# Patient Record
Sex: Female | Born: 1965 | Race: White | Hispanic: No | Marital: Married | Smoking: Never smoker
Health system: Southern US, Community
[De-identification: ages and names within clinical notes are randomized; demographics above are authoritative.]

## PROBLEM LIST (undated history)

## (undated) HISTORY — PX: BREAST EXCISIONAL BIOPSY: SUR124

## (undated) HISTORY — PX: BREAST BIOPSY: SHX20

---

## 2007-08-06 DIAGNOSIS — D239 Other benign neoplasm of skin, unspecified: Secondary | ICD-10-CM

## 2007-08-06 HISTORY — DX: Other benign neoplasm of skin, unspecified: D23.9

## 2013-02-09 DIAGNOSIS — D229 Melanocytic nevi, unspecified: Secondary | ICD-10-CM

## 2013-02-09 HISTORY — DX: Melanocytic nevi, unspecified: D22.9

## 2020-02-25 ENCOUNTER — Encounter: Payer: Self-pay | Admitting: Emergency Medicine

## 2020-02-25 ENCOUNTER — Ambulatory Visit
Admission: EM | Admit: 2020-02-25 | Discharge: 2020-02-25 | Disposition: A | Discharge status: Home / Self Care | Payer: No Typology Code available for payment source | Attending: Sports Medicine | Admitting: Sports Medicine

## 2020-02-25 ENCOUNTER — Other Ambulatory Visit: Payer: Self-pay

## 2020-02-25 DIAGNOSIS — R051 Acute cough: Secondary | ICD-10-CM | POA: Diagnosis present

## 2020-02-25 DIAGNOSIS — J0100 Acute maxillary sinusitis, unspecified: Secondary | ICD-10-CM | POA: Diagnosis not present

## 2020-02-25 DIAGNOSIS — Z20822 Contact with and (suspected) exposure to covid-19: Secondary | ICD-10-CM | POA: Insufficient documentation

## 2020-02-25 DIAGNOSIS — Z79899 Other long term (current) drug therapy: Secondary | ICD-10-CM | POA: Diagnosis not present

## 2020-02-25 LAB — RESP PANEL BY RT-PCR (FLU A&B, COVID) ARPGX2
Influenza A by PCR: NEGATIVE
Influenza B by PCR: NEGATIVE
SARS Coronavirus 2 by RT PCR: NEGATIVE

## 2020-02-25 MED ORDER — BENZONATATE 100 MG PO CAPS: 200.0000 mg | ORAL_CAPSULE | Freq: Three times a day (TID) | ORAL | 0 refills | Status: AC

## 2020-02-25 MED ORDER — AMOXICILLIN-POT CLAVULANATE 875-125 MG PO TABS: 1.0000 | ORAL_TABLET | Freq: Two times a day (BID) | ORAL | 0 refills | Status: AC

## 2020-02-25 NOTE — ED Triage Notes (Signed)
Pt c/o cough, nasal congestion. Started about 3 days ago. She has had covid vaccines.

## 2020-02-25 NOTE — ED Provider Notes (Signed)
[facility]    CSN: [id number] Arrival date & time: 02/25/20  1110      History   Chief Complaint Chief Complaint  Patient presents with  . Nasal Congestion  . Cough    HPI Priscilla Bradshaw is a 54 y.o. female.   HPI   54 year old female here for evaluation of nasal congestion and cough.  Patient reports that she has had symptoms for the past 3 days.  She has associated symptoms of ear pressure, headache, scratchy throat, yellow nasal discharge, pain in her teeth, and nonproductive cough.  Patient denies fever, shortness of breath or wheezing, body aches, changes to sense of taste or smell.  Patient has received both her Covid and flu vaccines but not her Covid booster.  History reviewed. No pertinent past medical history.  There are no problems to display for this patient.   History reviewed. No pertinent surgical history.  OB History   No obstetric history on file.      Home Medications    Prior to Admission medications   Medication Sig Start Date End Date Taking? Authorizing Provider  atorvastatin (LIPITOR) 10 MG tablet Take 10 mg by mouth daily. 12/26/19  Yes [provider]  citalopram (CELEXA) 40 MG tablet Take 40 mg by mouth daily. 12/26/19  Yes [provider]  amoxicillin-clavulanate (AUGMENTIN) 875-125 MG tablet Take 1 tablet by mouth every 12 (twelve) hours for 10 days. 02/25/20 03/06/20  Margarette Canada, NP  benzonatate (TESSALON) 100 MG capsule Take 2 capsules (200 mg total) by mouth every 8 (eight) hours. 02/25/20   Margarette Canada, NP    Family History History reviewed. No pertinent family history.  Social History Social History   Tobacco Use  . Smoking status: Never Smoker  . Smokeless tobacco: Never Used  Vaping Use  . Vaping Use: Never used  Substance Use Topics  . Alcohol use: Not Currently  . Drug use: Not Currently     Allergies   Patient has no known allergies.   Review of Systems Review of Systems   Constitutional: Negative for activity change, appetite change and fever.  HENT: Positive for congestion, dental problem, ear pain, sinus pressure, sinus pain and sore throat. Negative for rhinorrhea.   Respiratory: Positive for cough. Negative for shortness of breath and wheezing.   Cardiovascular: Negative for chest pain.  Musculoskeletal: Negative for arthralgias and myalgias.  Skin: Negative for rash.  Neurological: Positive for headaches.  Hematological: Negative.   Psychiatric/Behavioral: Negative.      Physical Exam Triage Vital Signs ED Triage Vitals  Enc Vitals Group     BP 02/25/20 1212 120/69     Pulse Rate 02/25/20 1212 84     Resp 02/25/20 1212 18     Temp 02/25/20 1212 98.4 F (36.9 C)     Temp Source 02/25/20 1212 Oral     SpO2 02/25/20 1212 100 %     Weight 02/25/20 1210 153 lb (69.4 kg)     Height 02/25/20 1210 $RemoveBefor'5\' 3"'EkcTYkiqupHa$  (1.6 m)     Head Circumference --      Peak Flow --      Pain Score 02/25/20 1210 6     Pain Loc --      Pain Edu? --      Excl. in [place]? --    No data found.  Updated Vital Signs BP 120/69 (BP Location: Right Arm)   Pulse 84   Temp 98.4 F (36.9 C) (Oral)   Resp  18   Ht $R'5\' 3"'Dl$  (1.6 m)   Wt 153 lb (69.4 kg)   LMP  (Approximate)   SpO2 100%   BMI 27.10 kg/m   Visual Acuity Right Eye Distance:   Left Eye Distance:   Bilateral Distance:    Right Eye Near:   Left Eye Near:    Bilateral Near:     Physical Exam Vitals and nursing note reviewed.  Constitutional:      General: She is not in acute distress.    Appearance: Normal appearance. She is normal weight. She is ill-appearing.  HENT:     Head: Normocephalic and atraumatic.     Right Ear: Tympanic membrane, ear canal and external ear normal.     Left Ear: Tympanic membrane, ear canal and external ear normal.     Nose: Congestion and rhinorrhea present.     Comments: Patient has marked edema and erythema of bilateral nasal mucosa.  There is thick yellow discharge present in  both nares.  Patient does have tenderness to percussion of frontal and maxillary sinuses.    Mouth/Throat:     Mouth: Mucous membranes are moist.     Pharynx: Oropharynx is clear. Posterior oropharyngeal erythema present. No oropharyngeal exudate.     Comments: Posterior oropharynx has mild erythema with yellow postnasal drip. Cardiovascular:     Rate and Rhythm: Normal rate and regular rhythm.     Pulses: Normal pulses.     Heart sounds: Normal heart sounds. No murmur heard. No gallop.   Pulmonary:     Effort: Pulmonary effort is normal.     Breath sounds: Normal breath sounds. No wheezing, rhonchi or rales.  Skin:    General: Skin is warm and dry.     Capillary Refill: Capillary refill takes less than 2 seconds.     Findings: No erythema or rash.  Neurological:     General: No focal deficit present.     Mental Status: She is alert and oriented to person, place, and time.  Psychiatric:        Mood and Affect: Mood normal.        Behavior: Behavior normal.        Thought Content: Thought content normal.        Judgment: Judgment normal.      UC Treatments / Results  Labs (all labs ordered are listed, but only abnormal results are displayed) Labs Reviewed  RESP PANEL BY RT-PCR (FLU A&B, COVID) [id number]    EKG   Radiology No results found.  Procedures Procedures (including critical care time)  Medications Ordered in UC Medications - No data to display  Initial Impression / Assessment and Plan / UC Course  I have reviewed the triage vital signs and the nursing notes.  Pertinent labs & imaging results that were available during my care of the patient were reviewed by me and considered in my medical decision making (see chart for details).   Patient is here for evaluation of nasal congestion and dry cough that been going on for last 3 days.  Patient describes she feels a lot of pain and pressure in her forehead and her cheeks.  She is also experiencing pain in her upper  teeth.  Patient's not had a fever but she does have a longstanding history of maxillary sinusitis.  There is marked edema and erythema of her nasal mucosa bilaterally with thick yellow discharge.  Suspect patient has an acute on chronic maxillary sinusitis.  Will place patient  on Augmentin twice daily x10 days, encourage sinus irrigation, and give Tessalon Perles for cough.  Patient reports that decongestants caused tachycardia and palpitations with her.  Will skip Promethazine DM.  Respiratory panel was collected at triage and patient is negative for Covid and flu.   Final Clinical Impressions(s) / UC Diagnoses   Final diagnoses:  Acute maxillary sinusitis, recurrence not specified     Discharge Instructions     Take the Augmentin twice daily for 10 days with food to treat your sinusitis.  Perform sinus irrigation with a [employer] sinus rinse kit and distilled water 2-3 times a day to help break up nasal congestion.  Increase your oral fluid intake of water to help thin out your mucus and help your body clear.  You can take over-the-counter plain Mucinex to break up the stickiness of your mucus and make it easier for your body to clear.  Use the Tessalon Perles every 8 hours as needed for cough.  Take them with a small sip of water.  Some people will have an numbness to the base of your tongue or metallic taste in her mouth, this is normal.  If your symptoms persist follow-up with ear nose and throat.    ED Prescriptions    Medication Sig Dispense Auth. Provider   amoxicillin-clavulanate (AUGMENTIN) 875-125 MG tablet Take 1 tablet by mouth every 12 (twelve) hours for 10 days. 20 tablet Margarette Canada, NP   benzonatate (TESSALON) 100 MG capsule Take 2 capsules (200 mg total) by mouth every 8 (eight) hours. 21 capsule Margarette Canada, NP     PDMP not reviewed this encounter.   Margarette Canada, NP 02/25/20 1355

## 2020-02-25 NOTE — Discharge Instructions (Addendum)
Take the Augmentin twice daily for 10 days with food to treat your sinusitis.  Perform sinus irrigation with a NeilMed sinus rinse kit and distilled water 2-3 times a day to help break up nasal congestion.  Increase your oral fluid intake of water to help thin out your mucus and help your body clear.  You can take over-the-counter plain Mucinex to break up the stickiness of your mucus and make it easier for your body to clear.  Use the Tessalon Perles every 8 hours as needed for cough.  Take them with a small sip of water.  Some people will have an numbness to the base of your tongue or metallic taste in her mouth, this is normal.  If your symptoms persist follow-up with ear nose and throat.

## 2020-05-06 ENCOUNTER — Ambulatory Visit: Payer: Self-pay | Admitting: Dermatology

## 2020-06-21 ENCOUNTER — Ambulatory Visit: Payer: Self-pay | Admitting: Dermatology

## 2020-07-01 ENCOUNTER — Ambulatory Visit: Payer: No Typology Code available for payment source | Admitting: Dermatology

## 2020-09-01 ENCOUNTER — Ambulatory Visit (INDEPENDENT_AMBULATORY_CARE_PROVIDER_SITE_OTHER): Payer: No Typology Code available for payment source | Admitting: Dermatology

## 2020-09-01 ENCOUNTER — Other Ambulatory Visit: Payer: Self-pay

## 2020-09-01 DIAGNOSIS — L821 Other seborrheic keratosis: Secondary | ICD-10-CM

## 2020-09-01 DIAGNOSIS — Z86018 Personal history of other benign neoplasm: Secondary | ICD-10-CM | POA: Diagnosis not present

## 2020-09-01 DIAGNOSIS — L578 Other skin changes due to chronic exposure to nonionizing radiation: Secondary | ICD-10-CM

## 2020-09-01 DIAGNOSIS — D1800 Hemangioma unspecified site: Secondary | ICD-10-CM

## 2020-09-01 DIAGNOSIS — D485 Neoplasm of uncertain behavior of skin: Secondary | ICD-10-CM | POA: Diagnosis not present

## 2020-09-01 DIAGNOSIS — L918 Other hypertrophic disorders of the skin: Secondary | ICD-10-CM

## 2020-09-01 DIAGNOSIS — D229 Melanocytic nevi, unspecified: Secondary | ICD-10-CM

## 2020-09-01 DIAGNOSIS — L814 Other melanin hyperpigmentation: Secondary | ICD-10-CM | POA: Diagnosis not present

## 2020-09-01 DIAGNOSIS — Z1283 Encounter for screening for malignant neoplasm of skin: Secondary | ICD-10-CM | POA: Diagnosis not present

## 2020-09-01 DIAGNOSIS — D492 Neoplasm of unspecified behavior of bone, soft tissue, and skin: Secondary | ICD-10-CM

## 2020-09-01 NOTE — Patient Instructions (Signed)
Wound Care Instructions  Cleanse wound gently with soap and water once a day then pat dry with clean gauze. Apply a thing coat of Petrolatum (petroleum jelly, "Vaseline") over the wound (unless you have an allergy to this). We recommend that you use a new, sterile tube of Vaseline. Do not pick or remove scabs. Do not remove the yellow or white "healing tissue" from the base of the wound.  Cover the wound with fresh, clean, nonstick gauze and secure with paper tape. You may use Band-Aids in place of gauze and tape if the would is small enough, but would recommend trimming much of the tape off as there is often too much. Sometimes Band-Aids can irritate the skin.  You should call the office for your biopsy report after 1 week if you have not already been contacted.  If you experience any problems, such as abnormal amounts of bleeding, swelling, significant bruising, significant pain, or evidence of infection, please call the office immediately.  FOR ADULT SURGERY PATIENTS: If you need something for pain relief you may take 1 extra strength Tylenol (acetaminophen) AND 2 Ibuprofen (200mg each) together every 4 hours as needed for pain. (do not take these if you are allergic to them or if you have a reason you should not take them.) Typically, you may only need pain medication for 1 to 3 days.   If you have any questions or concerns for your doctor, please call our main line at [phone] and press option 4 to reach your doctor's medical assistant. If no one answers, please leave a voicemail as directed and we will return your call as soon as possible. Messages left after 4 pm will be answered the following business day.   You may also send us a message via MyChart. We typically respond to MyChart messages within 1-2 business days.  For prescription refills, please ask your pharmacy to contact our office. Our fax number is [phone].  If you have an urgent issue when the clinic is closed that  cannot wait until the next business day, you can page your doctor at the number below.    Please note that while we do our best to be available for urgent issues outside of office hours, we are not available 24/7.   If you have an urgent issue and are unable to reach us, you may choose to seek medical care at your doctor's office, retail clinic, urgent care center, or emergency room.  If you have a medical emergency, please immediately call 911 or go to the emergency department.  Pager Numbers  - Dr. Kowalski: [phone]  - Dr. Moye: [phone]  - Dr. Stewart: [phone]  In the event of inclement weather, please call our main line at [phone] for an update on the status of any delays or closures.  Dermatology Medication Tips: Please keep the boxes that topical medications come in in order to help keep track of the instructions about where and how to use these. Pharmacies typically print the medication instructions only on the boxes and not directly on the medication tubes.   If your medication is too expensive, please contact our office at [phone] option 4 or send us a message through MyChart.   We are unable to tell what your co-pay for medications will be in advance as this is different depending on your insurance coverage. However, we may be able to find a substitute medication at lower cost or fill out paperwork to get insurance to cover a needed   medication.   If a prior authorization is required to get your medication covered by your insurance company, please allow us 1-2 business days to complete this process.  Drug prices often vary depending on where the prescription is filled and some pharmacies may offer cheaper prices.  The website www.goodrx.com contains coupons for medications through different pharmacies. The prices here do not account for what the cost may be with help from insurance (it may be cheaper with your insurance), but the website can give you the  price if you did not use any insurance.  - You can print the associated coupon and take it with your prescription to the pharmacy.  - You may also stop by our office during regular business hours and pick up a GoodRx coupon card.  - If you need your prescription sent electronically to a different pharmacy, notify our office through [facility] MyChart or by phone at [phone] option 4.   

## 2020-09-01 NOTE — Progress Notes (Signed)
Follow-Up Visit   Subjective  Priscilla Bradshaw is a 55 y.o. female who presents for the following: Annual Exam. Yearly mole check, hx of Dysplastic nevus. Pt c/o irritated mole on the left neck she would like removed today. The patient presents for Total-Body Skin Exam (TBSE) for skin cancer screening and mole check.   The following portions of the chart were reviewed this encounter and updated as appropriate:   Tobacco  Allergies  Meds  Problems  Med Hx  Surg Hx  Fam Hx      Review of Systems:  No other skin or systemic complaints except as noted in HPI or Assessment and Plan.  Objective  Well appearing patient in no apparent distress; mood and affect are within normal limits.  A full examination was performed including scalp, head, eyes, ears, nose, lips, neck, chest, axillae, abdomen, back, buttocks, bilateral upper extremities, bilateral lower extremities, hands, feet, fingers, toes, fingernails, and toenails. All findings within normal limits unless otherwise noted below.  left anterior neck 0.6 cm pink papule    Assessment & Plan  Neoplasm of skin left anterior neck  Epidermal / dermal shaving  Lesion diameter (cm):  0.6 Informed consent: discussed and consent obtained   Timeout: patient name, date of birth, surgical site, and procedure verified   Procedure prep:  Patient was prepped and draped in usual sterile fashion Prep type:  Isopropyl alcohol Anesthesia: the lesion was anesthetized in a standard fashion   Anesthetic:  1% lidocaine w/ epinephrine 1-100,000 buffered w/ 8.4% NaHCO3 Hemostasis achieved with: pressure, aluminum chloride and electrodesiccation   Outcome: patient tolerated procedure well   Post-procedure details: sterile dressing applied and wound care instructions given   Dressing type: bandage and petrolatum    Specimen 1 - Surgical pathology Differential Diagnosis: Irritated nevus R/O Dysplastic nevus   Check Margins: No  Lentigines -  Scattered tan macules - Due to sun exposure - Benign-appering, observe - Recommend daily broad spectrum sunscreen SPF 30+ to sun-exposed areas, reapply every 2 hours as needed. - Call for any changes  Seborrheic Keratoses - Stuck-on, waxy, tan-brown papules and/or plaques  - Benign-appearing - Discussed benign etiology and prognosis. - Observe - Call for any changes  Melanocytic Nevi - Tan-brown and/or pink-flesh-colored symmetric macules and papules - Benign appearing on exam today - Observation - Call clinic for new or changing moles - Recommend daily use of broad spectrum spf 30+ sunscreen to sun-exposed areas.   Hemangiomas - Red papules - Discussed benign nature - Observe - Call for any changes  Actinic Damage - Chronic condition, secondary to cumulative UV/sun exposure - diffuse scaly erythematous macules with underlying dyspigmentation - Recommend daily broad spectrum sunscreen SPF 30+ to sun-exposed areas, reapply every 2 hours as needed.  - Staying in the shade or wearing long sleeves, sun glasses (UVA+UVB protection) and wide brim hats (4-inch brim around the entire circumference of the hat) are also recommended for sun protection.  - Call for new or changing lesions.  Acrochordons (Skin Tags) Axillary area  - Fleshy, skin-colored pedunculated papules - Benign appearing.  - Observe. - If desired, they can be removed with an in office procedure that is not covered by insurance. - Please call the clinic if you notice any new or changing lesions.   History of Dysplastic Nevi Multiple see history  - No evidence of recurrence today - Recommend regular full body skin exams - Recommend daily broad spectrum sunscreen SPF 30+ to sun-exposed areas, reapply every  2 hours as needed.  - Call if any new or changing lesions are noted between office visits   Skin cancer screening performed today.   Return in about 1 year (around 09/01/2021) for TBSE, Hx of Dysplastic nevus  .  IMarye Round, CMA, am acting as scribe for Sarina Ser, MD .  Documentation: I have reviewed the above documentation for accuracy and completeness, and I agree with the above.  Sarina Ser, MD

## 2020-09-09 ENCOUNTER — Telehealth: Payer: Self-pay

## 2020-09-09 NOTE — Telephone Encounter (Signed)
-----   Message from Brendolyn Patty, MD sent at 09/08/2020  8:51 PM EDT ----- Skin , left anterior neck FIBROEPITHELIAL POLYP WITH FEATURES OF VERRUCA, INFLAMED  Inflamed skin tag with wart features, benign - please call pt

## 2020-09-09 NOTE — Telephone Encounter (Signed)
Left message for patient to call office for results/hd 

## 2020-09-11 ENCOUNTER — Encounter: Payer: Self-pay | Admitting: Dermatology

## 2020-09-16 ENCOUNTER — Telehealth: Payer: Self-pay

## 2020-09-16 NOTE — Telephone Encounter (Signed)
Left message for patient to call office for results/hd 

## 2020-09-16 NOTE — Telephone Encounter (Signed)
-----   Message from Brendolyn Patty, MD sent at 09/08/2020  8:51 PM EDT ----- Skin , left anterior neck FIBROEPITHELIAL POLYP WITH FEATURES OF VERRUCA, INFLAMED  Inflamed skin tag with wart features, benign - please call pt

## 2020-09-22 ENCOUNTER — Telehealth: Payer: Self-pay

## 2020-09-22 NOTE — Telephone Encounter (Signed)
Patient advised bx results benign skin tag with wart features, JS

## 2020-09-22 NOTE — Telephone Encounter (Signed)
-----   Message from Brendolyn Patty, MD sent at 09/08/2020  8:51 PM EDT ----- Skin , left anterior neck FIBROEPITHELIAL POLYP WITH FEATURES OF VERRUCA, INFLAMED  Inflamed skin tag with wart features, benign - please call pt

## 2020-09-28 ENCOUNTER — Telehealth: Payer: Self-pay

## 2020-09-28 NOTE — Telephone Encounter (Signed)
-----   Message from Brendolyn Patty, MD sent at 09/08/2020  8:51 PM EDT ----- Skin , left anterior neck FIBROEPITHELIAL POLYP WITH FEATURES OF VERRUCA, INFLAMED  Inflamed skin tag with wart features, benign - please call pt

## 2020-09-28 NOTE — Telephone Encounter (Signed)
Patient advised biopsy was benign.

## 2020-12-10 ENCOUNTER — Other Ambulatory Visit: Payer: Self-pay | Admitting: Family Medicine

## 2020-12-10 DIAGNOSIS — Z1231 Encounter for screening mammogram for malignant neoplasm of breast: Secondary | ICD-10-CM

## 2020-12-28 ENCOUNTER — Other Ambulatory Visit (HOSPITAL_BASED_OUTPATIENT_CLINIC_OR_DEPARTMENT_OTHER): Payer: Self-pay | Admitting: Orthopedic Surgery

## 2020-12-28 ENCOUNTER — Other Ambulatory Visit: Payer: Self-pay | Admitting: Orthopedic Surgery

## 2020-12-28 DIAGNOSIS — G8929 Other chronic pain: Secondary | ICD-10-CM

## 2020-12-28 DIAGNOSIS — M5442 Lumbago with sciatica, left side: Secondary | ICD-10-CM

## 2020-12-28 DIAGNOSIS — M5137 Other intervertebral disc degeneration, lumbosacral region: Secondary | ICD-10-CM

## 2020-12-29 ENCOUNTER — Other Ambulatory Visit: Payer: Self-pay

## 2020-12-29 ENCOUNTER — Ambulatory Visit
Admission: RE | Admit: 2020-12-29 | Discharge: 2020-12-29 | Disposition: A | Discharge status: Home / Self Care | Payer: No Typology Code available for payment source | Source: Ambulatory Visit | Attending: Family Medicine | Admitting: Family Medicine

## 2020-12-29 DIAGNOSIS — Z1231 Encounter for screening mammogram for malignant neoplasm of breast: Secondary | ICD-10-CM | POA: Diagnosis not present

## 2020-12-31 ENCOUNTER — Inpatient Hospital Stay
Admission: RE | Admit: 2020-12-31 | Discharge: 2020-12-31 | Disposition: A | Discharge status: Home / Self Care | Payer: Self-pay | Source: Ambulatory Visit | Attending: *Deleted | Admitting: *Deleted

## 2020-12-31 ENCOUNTER — Other Ambulatory Visit: Payer: Self-pay | Admitting: *Deleted

## 2020-12-31 DIAGNOSIS — Z1231 Encounter for screening mammogram for malignant neoplasm of breast: Secondary | ICD-10-CM

## 2021-01-05 ENCOUNTER — Other Ambulatory Visit: Payer: Self-pay

## 2021-01-05 ENCOUNTER — Ambulatory Visit
Admission: RE | Admit: 2021-01-05 | Discharge: 2021-01-05 | Disposition: A | Discharge status: Home / Self Care | Payer: No Typology Code available for payment source | Source: Ambulatory Visit | Attending: Orthopedic Surgery | Admitting: Orthopedic Surgery

## 2021-01-05 DIAGNOSIS — G8929 Other chronic pain: Secondary | ICD-10-CM | POA: Insufficient documentation

## 2021-01-05 DIAGNOSIS — M5442 Lumbago with sciatica, left side: Secondary | ICD-10-CM | POA: Insufficient documentation

## 2021-01-05 DIAGNOSIS — M5137 Other intervertebral disc degeneration, lumbosacral region: Secondary | ICD-10-CM | POA: Insufficient documentation

## 2021-09-08 ENCOUNTER — Encounter: Payer: No Typology Code available for payment source | Admitting: Dermatology

## 2021-09-21 ENCOUNTER — Ambulatory Visit (INDEPENDENT_AMBULATORY_CARE_PROVIDER_SITE_OTHER): Payer: No Typology Code available for payment source | Admitting: Dermatology

## 2021-09-21 DIAGNOSIS — L738 Other specified follicular disorders: Secondary | ICD-10-CM | POA: Diagnosis not present

## 2021-09-21 DIAGNOSIS — L578 Other skin changes due to chronic exposure to nonionizing radiation: Secondary | ICD-10-CM

## 2021-09-21 DIAGNOSIS — D229 Melanocytic nevi, unspecified: Secondary | ICD-10-CM

## 2021-09-21 DIAGNOSIS — Z86018 Personal history of other benign neoplasm: Secondary | ICD-10-CM

## 2021-09-21 DIAGNOSIS — Z1283 Encounter for screening for malignant neoplasm of skin: Secondary | ICD-10-CM | POA: Diagnosis not present

## 2021-09-21 DIAGNOSIS — D239 Other benign neoplasm of skin, unspecified: Secondary | ICD-10-CM

## 2021-09-21 DIAGNOSIS — D1800 Hemangioma unspecified site: Secondary | ICD-10-CM

## 2021-09-21 DIAGNOSIS — L821 Other seborrheic keratosis: Secondary | ICD-10-CM

## 2021-09-21 DIAGNOSIS — L814 Other melanin hyperpigmentation: Secondary | ICD-10-CM

## 2021-09-21 NOTE — Progress Notes (Signed)
   Follow-Up Visit   Subjective  Priscilla Bradshaw is a 56 y.o. female who presents for the following: Annual Exam (History of dysplastic nevi - The patient presents for Total-Body Skin Exam (TBSE) for skin cancer screening and mole check.  The patient has spots, moles and lesions to be evaluated, some may be new or changing and the patient has concerns that these could be cancer./).  The following portions of the chart were reviewed this encounter and updated as appropriate:   Tobacco  Allergies  Meds  Problems  Med Hx  Surg Hx  Fam Hx     Review of Systems:  No other skin or systemic complaints except as noted in HPI or Assessment and Plan.  Objective  Well appearing patient in no apparent distress; mood and affect are within normal limits.  A full examination was performed including scalp, head, eyes, ears, nose, lips, neck, chest, axillae, abdomen, back, buttocks, bilateral upper extremities, bilateral lower extremities, hands, feet, fingers, toes, fingernails, and toenails. All findings within normal limits unless otherwise noted below.  Face Yellow papules   Assessment & Plan   History of Dysplastic Nevi - No evidence of recurrence today - Recommend regular full body skin exams - Recommend daily broad spectrum sunscreen SPF 30+ to sun-exposed areas, reapply every 2 hours as needed.  - Call if any new or changing lesions are noted between office visits  Lentigines - Scattered tan macules - Due to sun exposure - Benign-appearing, observe - Recommend daily broad spectrum sunscreen SPF 30+ to sun-exposed areas, reapply every 2 hours as needed. - Call for any changes  Seborrheic Keratoses - Stuck-on, waxy, tan-brown papules and/or plaques  - Benign-appearing - Discussed benign etiology and prognosis. - Observe - Call for any changes  Melanocytic Nevi - Tan-brown and/or pink-flesh-colored symmetric macules and papules - Benign appearing on exam today - Observation -  Call clinic for new or changing moles - Recommend daily use of broad spectrum spf 30+ sunscreen to sun-exposed areas.   Hemangiomas - Red papules - Discussed benign nature - Observe - Call for any changes  Actinic Damage - Chronic condition, secondary to cumulative UV/sun exposure - diffuse scaly erythematous macules with underlying dyspigmentation - Recommend daily broad spectrum sunscreen SPF 30+ to sun-exposed areas, reapply every 2 hours as needed.  - Staying in the shade or wearing long sleeves, sun glasses (UVA+UVB protection) and wide brim hats (4-inch brim around the entire circumference of the hat) are also recommended for sun protection.  - Call for new or changing lesions.  Skin cancer screening performed today.  Sebaceous hyperplasia Face Benign, observe.   Return in about 1 year (around 09/22/2022) for TBSE.  I, Ashok Cordia, CMA, am acting as scribe for Sarina Ser, MD . Documentation: I have reviewed the above documentation for accuracy and completeness, and I agree with the above.  Sarina Ser, MD

## 2021-09-21 NOTE — Patient Instructions (Addendum)
Due to recent changes in healthcare laws, you may see results of your pathology and/or laboratory studies on MyChart before the doctors have had a chance to review them. We understand that in some cases there may be results that are confusing or concerning to you. Please understand that not all results are received at the same time and often the doctors may need to interpret multiple results in order to provide you with the best plan of care or course of treatment. Therefore, we ask that you please give us 2 business days to thoroughly review all your results before contacting the office for clarification. Should we see a critical lab result, you will be contacted sooner.   If You Need Anything After Your Visit  If you have any questions or concerns for your doctor, please call our main line at [phone] and press option 4 to reach your doctor's medical assistant. If no one answers, please leave a voicemail as directed and we will return your call as soon as possible. Messages left after 4 pm will be answered the following business day.   You may also send us a message via MyChart. We typically respond to MyChart messages within 1-2 business days.  For prescription refills, please ask your pharmacy to contact our office. Our fax number is [phone].  If you have an urgent issue when the clinic is closed that cannot wait until the next business day, you can page your doctor at the number below.    Please note that while we do our best to be available for urgent issues outside of office hours, we are not available 24/7.   If you have an urgent issue and are unable to reach us, you may choose to seek medical care at your doctor's office, retail clinic, urgent care center, or emergency room.  If you have a medical emergency, please immediately call 911 or go to the emergency department.  Pager Numbers  - Dr. Kowalski: [phone]  - Dr. Moye: [phone]  - Dr. Stewart:  [phone]  In the event of inclement weather, please call our main line at [phone] for an update on the status of any delays or closures.  Dermatology Medication Tips: Please keep the boxes that topical medications come in in order to help keep track of the instructions about where and how to use these. Pharmacies typically print the medication instructions only on the boxes and not directly on the medication tubes.   If your medication is too expensive, please contact our office at [phone] option 4 or send us a message through MyChart.   We are unable to tell what your co-pay for medications will be in advance as this is different depending on your insurance coverage. However, we may be able to find a substitute medication at lower cost or fill out paperwork to get insurance to cover a needed medication.   If a prior authorization is required to get your medication covered by your insurance company, please allow us 1-2 business days to complete this process.  Drug prices often vary depending on where the prescription is filled and some pharmacies may offer cheaper prices.  The website www.goodrx.com contains coupons for medications through different pharmacies. The prices here do not account for what the cost may be with help from insurance (it may be cheaper with your insurance), but the website can give you the price if you did not use any insurance.  - You can print the associated coupon and take it with   your prescription to the pharmacy.  - You may also stop by our office during regular business hours and pick up a GoodRx coupon card.  - If you need your prescription sent electronically to a different pharmacy, notify our office through [facility] MyChart or by phone at [phone] option 4.     Si Usted Necesita Algo Despus de Su Visita  Tambin puede enviarnos un mensaje a travs de MyChart. Por lo general respondemos a los mensajes de MyChart en el transcurso de 1 a 2  das hbiles.  Para renovar recetas, por favor pida a su farmacia que se ponga en contacto con nuestra oficina. Nuestro nmero de fax es el [phone].  Si tiene un asunto urgente cuando la clnica est cerrada y que no puede esperar hasta el siguiente da hbil, puede llamar/localizar a su doctor(a) al nmero que aparece a continuacin.   Por favor, tenga en cuenta que aunque hacemos todo lo posible para estar disponibles para asuntos urgentes fuera del horario de oficina, no estamos disponibles las 24 horas del da, los 7 das de la semana.   Si tiene un problema urgente y no puede comunicarse con nosotros, puede optar por buscar atencin mdica  en el consultorio de su doctor(a), en una clnica privada, en un centro de atencin urgente o en una sala de emergencias.  Si tiene una emergencia mdica, por favor llame inmediatamente al 911 o vaya a la sala de emergencias.  Nmeros de bper  - Dr. Kowalski: [phone]  - Dra. Moye: [phone]  - Dra. Stewart: [phone]  En caso de inclemencias del tiempo, por favor llame a nuestra lnea principal al [phone] para una actualizacin sobre el estado de cualquier retraso o cierre.  Consejos para la medicacin en dermatologa: Por favor, guarde las cajas en las que vienen los medicamentos de uso tpico para ayudarle a seguir las instrucciones sobre dnde y cmo usarlos. Las farmacias generalmente imprimen las instrucciones del medicamento slo en las cajas y no directamente en los tubos del medicamento.   Si su medicamento es muy caro, por favor, pngase en contacto con nuestra oficina llamando al [phone] y presione la opcin 4 o envenos un mensaje a travs de MyChart.   No podemos decirle cul ser su copago por los medicamentos por adelantado ya que esto es diferente dependiendo de la cobertura de su seguro. Sin embargo, es posible que podamos encontrar un medicamento sustituto a menor costo o llenar un formulario para que el  seguro cubra el medicamento que se considera necesario.   Si se requiere una autorizacin previa para que su compaa de seguros cubra su medicamento, por favor permtanos de 1 a 2 das hbiles para completar este proceso.  Los precios de los medicamentos varan con frecuencia dependiendo del lugar de dnde se surte la receta y alguna farmacias pueden ofrecer precios ms baratos.  El sitio web www.goodrx.com tiene cupones para medicamentos de diferentes farmacias. Los precios aqu no tienen en cuenta lo que podra costar con la ayuda del seguro (puede ser ms barato con su seguro), pero el sitio web puede darle el precio si no utiliz ningn seguro.  - Puede imprimir el cupn correspondiente y llevarlo con su receta a la farmacia.  - Tambin puede pasar por nuestra oficina durante el horario de atencin regular y recoger una tarjeta de cupones de GoodRx.  - Si necesita que su receta se enve electrnicamente a una farmacia diferente, informe a nuestra oficina a travs de MyChart de [place]   o por telfono llamando al [phone] y presione la opcin 4.  

## 2021-10-01 ENCOUNTER — Encounter: Payer: Self-pay | Admitting: Dermatology

## 2022-01-30 ENCOUNTER — Other Ambulatory Visit: Payer: Self-pay | Admitting: Family Medicine

## 2022-01-30 DIAGNOSIS — Z1231 Encounter for screening mammogram for malignant neoplasm of breast: Secondary | ICD-10-CM

## 2022-02-23 ENCOUNTER — Ambulatory Visit
Admission: RE | Admit: 2022-02-23 | Discharge: 2022-02-23 | Disposition: A | Discharge status: Home / Self Care | Payer: No Typology Code available for payment source | Source: Ambulatory Visit | Attending: Family Medicine | Admitting: Family Medicine

## 2022-02-23 DIAGNOSIS — Z1231 Encounter for screening mammogram for malignant neoplasm of breast: Secondary | ICD-10-CM | POA: Diagnosis present

## 2022-05-16 ENCOUNTER — Ambulatory Visit (INDEPENDENT_AMBULATORY_CARE_PROVIDER_SITE_OTHER): Payer: No Typology Code available for payment source

## 2022-05-16 DIAGNOSIS — Z86010 Personal history of colonic polyps: Secondary | ICD-10-CM | POA: Diagnosis present

## 2022-05-16 DIAGNOSIS — D123 Benign neoplasm of transverse colon: Secondary | ICD-10-CM | POA: Diagnosis not present

## 2022-05-16 DIAGNOSIS — K6389 Other specified diseases of intestine: Secondary | ICD-10-CM | POA: Diagnosis not present

## 2022-05-16 DIAGNOSIS — K635 Polyp of colon: Secondary | ICD-10-CM | POA: Diagnosis not present

## 2022-05-16 DIAGNOSIS — K640 First degree hemorrhoids: Secondary | ICD-10-CM | POA: Diagnosis not present

## 2022-09-21 ENCOUNTER — Ambulatory Visit: Payer: No Typology Code available for payment source | Admitting: Dermatology

## 2022-10-09 ENCOUNTER — Ambulatory Visit: Payer: No Typology Code available for payment source | Admitting: Dermatology

## 2022-11-15 IMAGING — MR MR LUMBAR SPINE W/O CM
4 of 5 series · 29 of 48 positions shown · non-contrast
Comparison: None.

CLINICAL DATA: Low back pain [REDACTED]

EXAM:
MRI LUMBAR SPINE WITHOUT CONTRAST
TECHNIQUE: Multiplanar, multisequence MR imaging of the lumbar spine was
performed. No intravenous contrast was administered.

[Series 2: T2 · sagittal · 4.0mm · 1.02mm/px · 5 of 15 slices shown (1 of 2)]
[im 1/15]
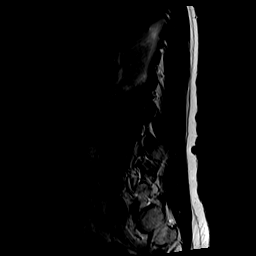
[im 4/15]
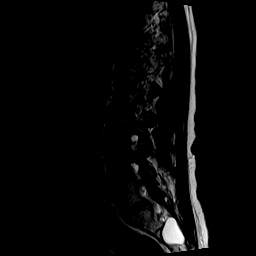
[im 8/15]
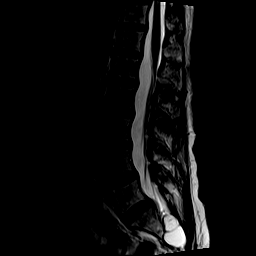
[im 11/15]
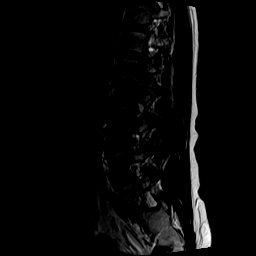
[im 15/15]
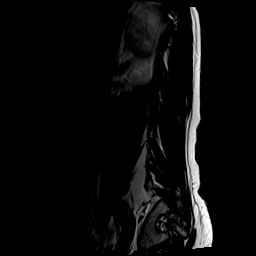

[Series 3: T1 · sagittal · 4.0mm · 0.51mm/px · 5 of 15 slices shown (1 of 2)]
[im 1/15]
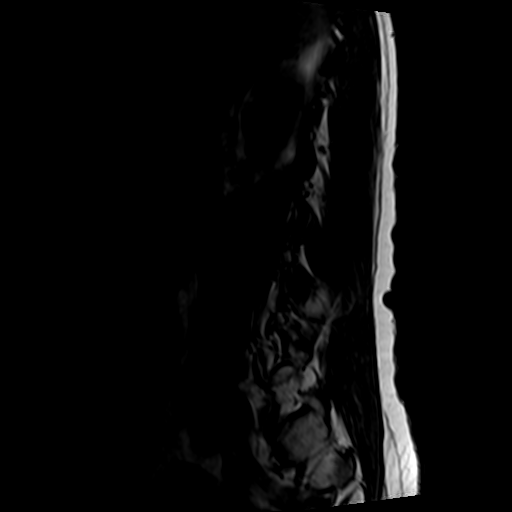
[im 4/15]
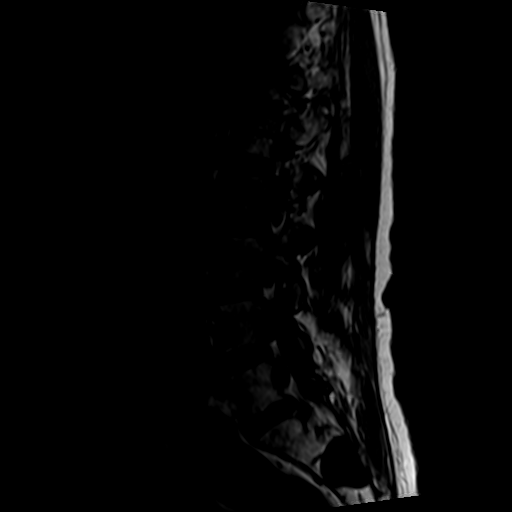
[im 8/15]
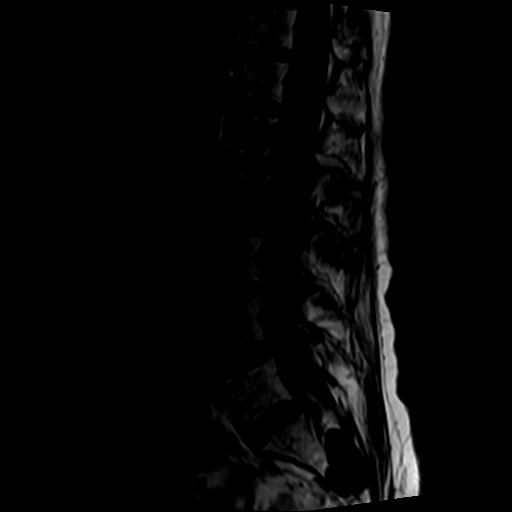
[im 11/15]
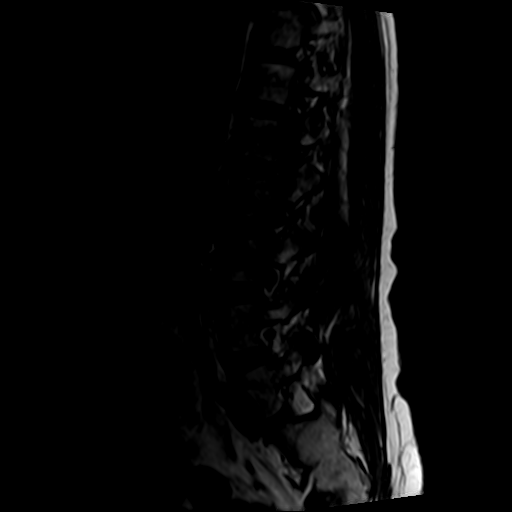
[im 15/15]
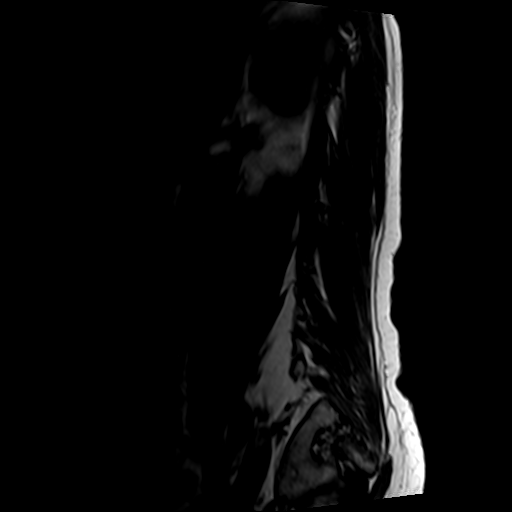

[Series 5: T2 · axial · 4.0mm · 0.78mm/px · z∈[-117,+113]mm · 10 of 42 slices shown (2 of 2)]
[im 3/42]
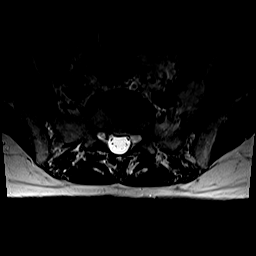
[im 6/42]
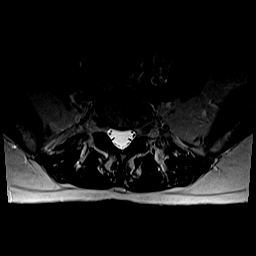
[im 9/42]
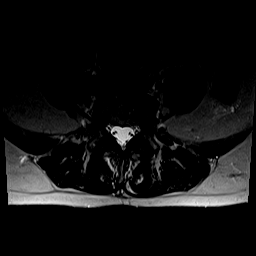
[im 14/42]
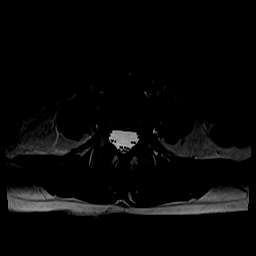
[im 20/42]
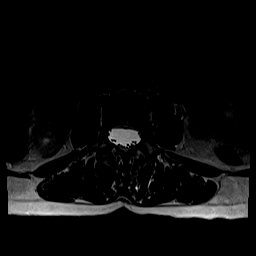
[im 22/42]
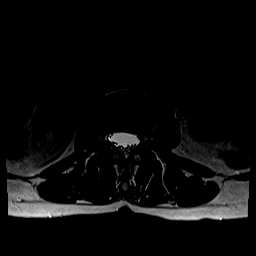
[im 25/42]
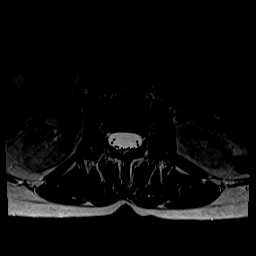
[im 31/42]
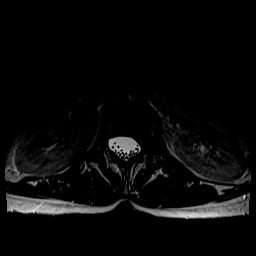
[im 36/42]
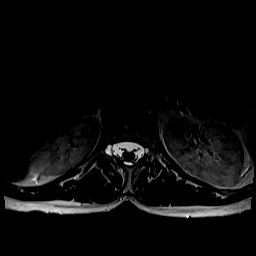
[im 42/42]
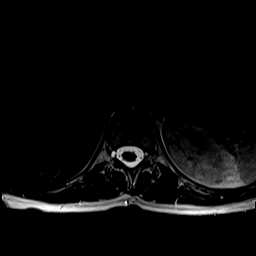

[Series 6: T1 · axial · 4.0mm · 0.39mm/px · z∈[-117,+83]mm · 9 of 42 slices shown (2 of 2)]
[im 3/42]
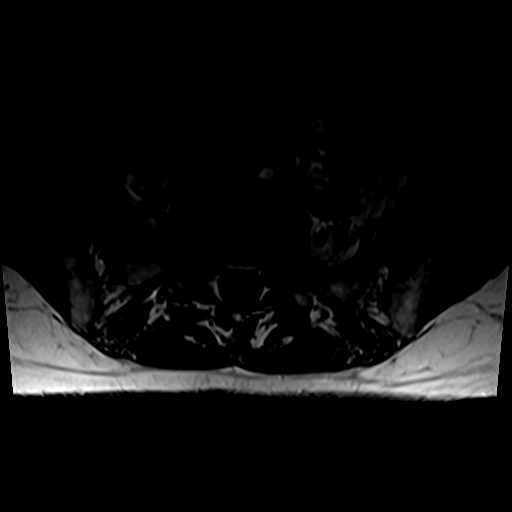
[im 6/42]
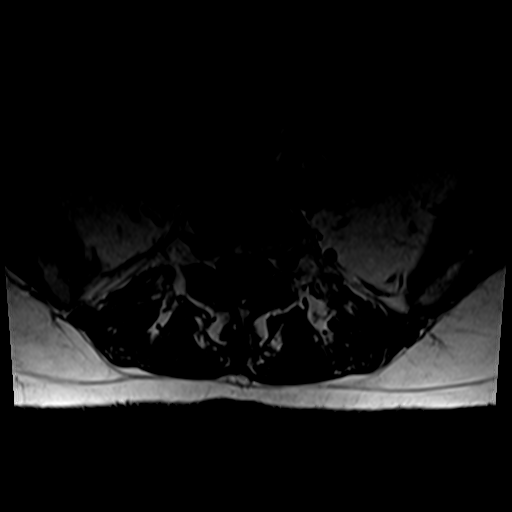
[im 9/42]
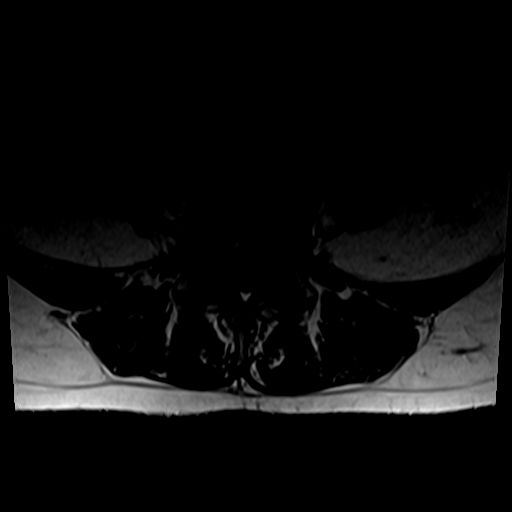
[im 14/42]
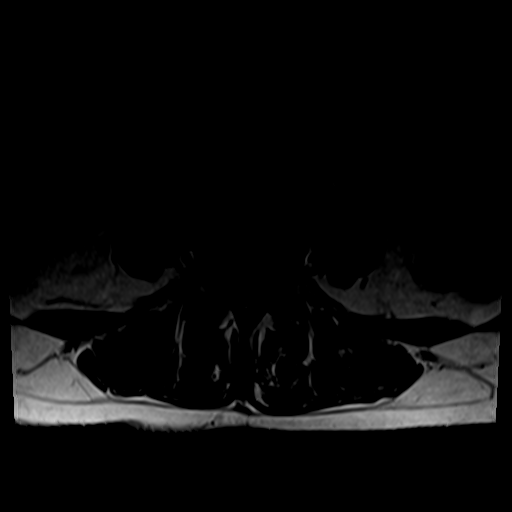
[im 20/42]
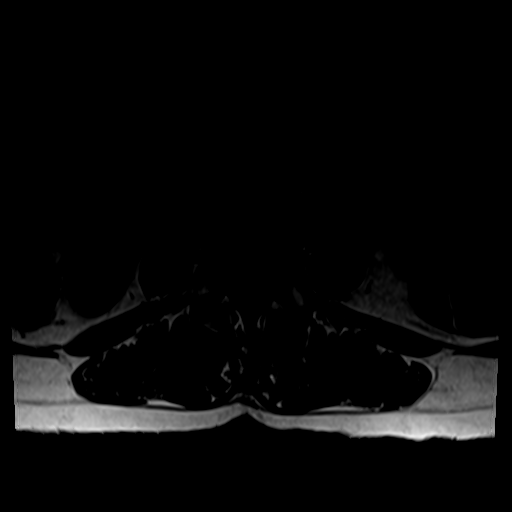
[im 22/42]
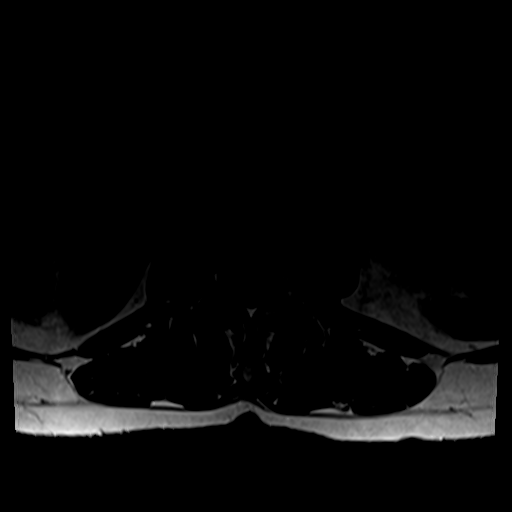
[im 25/42]
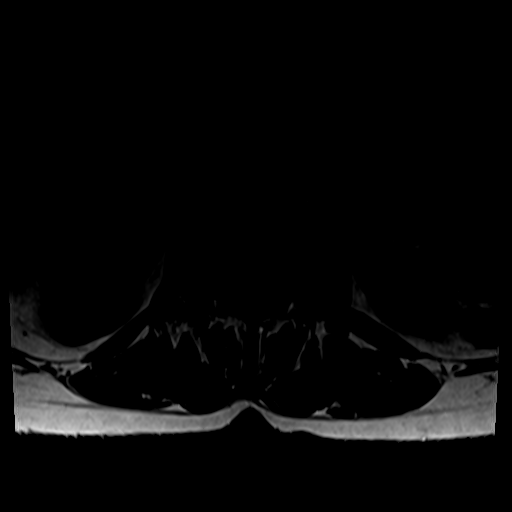
[im 31/42]
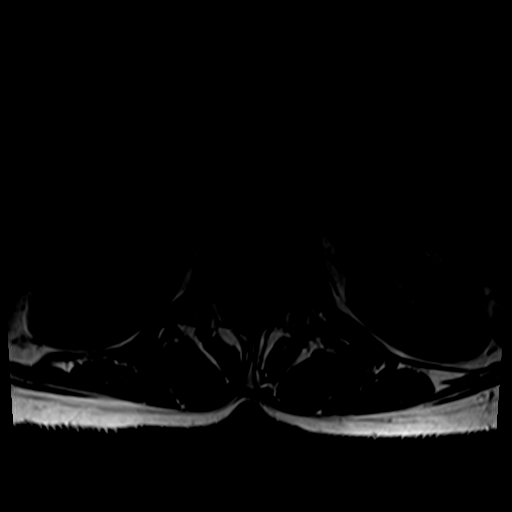
[im 36/42]
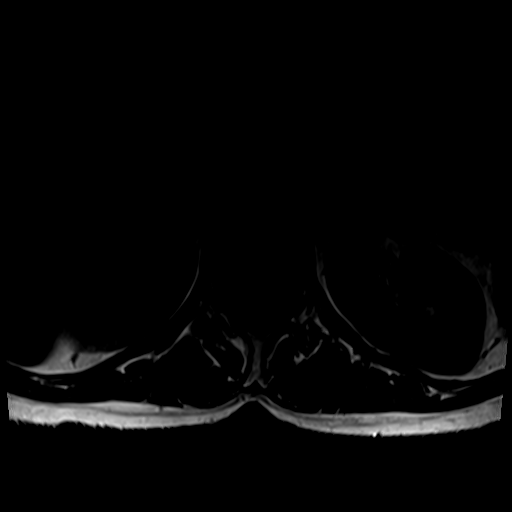

[29 of 48 positions shown; findings below may reference images not displayed]

FINDINGS: Segmentation:  Standard.

Alignment:  Choose 1

Vertebrae: No acute fracture or suspicious osseous lesion. Large
right greater than left Tarlov cysts remodeling the posterior aspect
of S2, seen only on sagittal images.

Conus medullaris and cauda equina: Conus extends to the L1 level.
Conus and cauda equina appear normal.

Paraspinal and other soft tissues: Choose 1

Disc levels:

T12-L1: Left subarticular disc extrusion with caudal migration,
which narrows the left lateral recess and likely contacts the
descending left L1 nerve. No spinal canal stenosis or neural
foraminal narrowing.

L1-L2: Intact intervertebral disc. Disc material from the T12-L1
disc extrusion may extend into the left L1-L2 neural foramen,
causing moderate left neural foraminal narrowing. No spinal canal
stenosis or right neural foraminal narrowing.

L2-L3: No significant disc bulge. No spinal canal stenosis or neural
foraminal narrowing.

L3-L4: No significant disc bulge. No spinal canal stenosis or neural
foraminal narrowing.

L4-L5: No significant disc bulge. No spinal canal stenosis or neural
foraminal narrowing. Small right perineural cyst.

L5-S1: No significant disc bulge. No spinal canal stenosis or neural
foraminal narrowing.
IMPRESSION: T12-L1 left subarticular disc extrusion with caudal migration, which
narrows the left lateral recess, contacts the descending left L1
nerve, and appears to extend to the left L1-L2 neural foramen,
causing moderate left neural foraminal narrowing at L1-L2. No spinal
canal stenosis.

## 2022-12-27 ENCOUNTER — Ambulatory Visit: Payer: Self-pay

## 2023-01-22 ENCOUNTER — Other Ambulatory Visit: Payer: Self-pay | Admitting: Family Medicine

## 2023-01-22 DIAGNOSIS — Z1231 Encounter for screening mammogram for malignant neoplasm of breast: Secondary | ICD-10-CM

## 2023-02-26 ENCOUNTER — Ambulatory Visit
Admission: RE | Admit: 2023-02-26 | Discharge: 2023-02-26 | Disposition: A | Discharge status: Home / Self Care | Payer: No Typology Code available for payment source | Source: Ambulatory Visit | Attending: Family Medicine | Admitting: Family Medicine

## 2023-02-26 DIAGNOSIS — Z1231 Encounter for screening mammogram for malignant neoplasm of breast: Secondary | ICD-10-CM | POA: Diagnosis present

## 2023-03-12 ENCOUNTER — Ambulatory Visit: Payer: No Typology Code available for payment source | Admitting: Dermatology

## 2023-03-13 ENCOUNTER — Ambulatory Visit: Payer: No Typology Code available for payment source | Admitting: Dermatology

## 2024-01-24 ENCOUNTER — Other Ambulatory Visit: Payer: Self-pay | Admitting: Family Medicine

## 2024-01-24 DIAGNOSIS — Z1231 Encounter for screening mammogram for malignant neoplasm of breast: Secondary | ICD-10-CM

## 2024-02-26 ENCOUNTER — Telehealth

## 2024-02-26 DIAGNOSIS — J069 Acute upper respiratory infection, unspecified: Secondary | ICD-10-CM | POA: Diagnosis not present

## 2024-02-26 DIAGNOSIS — B9689 Other specified bacterial agents as the cause of diseases classified elsewhere: Secondary | ICD-10-CM | POA: Diagnosis not present

## 2024-02-26 MED ORDER — AMOXICILLIN-POT CLAVULANATE 875-125 MG PO TABS: 1.0000 | ORAL_TABLET | Freq: Two times a day (BID) | ORAL | 0 refills | Status: AC

## 2024-02-26 MED ORDER — BENZONATATE 100 MG PO CAPS: 100.0000 mg | ORAL_CAPSULE | Freq: Three times a day (TID) | ORAL | 0 refills | Status: AC | PRN

## 2024-02-26 NOTE — Progress Notes (Signed)
 " Virtual Visit Consent   Priscilla Bradshaw, you are scheduled for a virtual visit with a [place] provider today. Just as with appointments in the office, your consent must be obtained to participate. Your consent will be active for this visit and any virtual visit you may have with one of our providers in the next 365 days. If you have a MyChart account, a copy of this consent can be sent to you electronically.  As this is a virtual visit, video technology does not allow for your provider to perform a traditional examination. This may limit your provider's ability to fully assess your condition. If your provider identifies any concerns that need to be evaluated in person or the need to arrange testing (such as labs, EKG, etc.), we will make arrangements to do so. Although advances in technology are sophisticated, we cannot ensure that it will always work on either your end or our end. If the connection with a video visit is poor, the visit may have to be switched to a telephone visit. With either a video or telephone visit, we are not always able to ensure that we have a secure connection.  By engaging in this virtual visit, you consent to the provision of healthcare and authorize for your insurance to be billed (if applicable) for the services provided during this visit. Depending on your insurance coverage, you may receive a charge related to this service.  I need to obtain your verbal consent now. Are you willing to proceed with your visit today? Priscilla Bradshaw has provided verbal consent on 02/26/2024 for a virtual visit (video or telephone). Priscilla CHRISTELLA Dickinson, [name]  Date: 02/26/2024 7:51 AM   Virtual Visit via Video Note   I, Priscilla Bradshaw, connected with  Priscilla Bradshaw  ([id number], [birth year]) on 02/26/2024 at  7:45 AM EST by a video-enabled telemedicine application and verified that I am speaking with the correct person using two identifiers.  Location: Patient: Virtual Visit  Location Patient: Home Provider: Virtual Visit Location Provider: Home Office   I discussed the limitations of evaluation and management by telemedicine and the availability of in person appointments. The patient expressed understanding and agreed to proceed.    History of Present Illness: Priscilla Bradshaw is a 58 y.o. who identifies as a female who was assigned female at birth, and is being seen today for URI symptoms.  HPI: URI  This is a new problem. The current episode started in the past 7 days (Started on Thursday, 02/21/24). The problem has been gradually worsening. There has been no fever. Associated symptoms include congestion, coughing (starting to become productive), headaches, a plugged ear sensation, rhinorrhea, sinus pain (started yesterday on right) and a sore throat (not sore, felt congested). Pertinent negatives include no diarrhea, ear pain, nausea, vomiting or wheezing. Treatments tried: coricidin hbp. The treatment provided no relief.  Negative at home Covid test this morning   Problems: There are no active problems to display for this patient.   Allergies: Allergies[1] Medications: Current Medications[2]  Observations/Objective: Patient is well-developed, well-nourished in no acute distress.  Resting comfortably at home.  Head is normocephalic, atraumatic.  No labored breathing.  Speech is clear and coherent with logical content.  Patient is alert and oriented at baseline.    Assessment and Plan: 1. Bacterial upper respiratory infection (Primary) - amoxicillin -clavulanate (AUGMENTIN ) 875-125 MG tablet; Take 1 tablet by mouth 2 (two) times daily.  Dispense: 14 tablet; Refill: 0 - benzonatate  (TESSALON ) 100 MG capsule; Take 1-2  capsules (100-200 mg total) by mouth 3 (three) times daily as needed.  Dispense: 30 capsule; Refill: 0  - Worsening symptoms that have not responded to OTC medications.  - Will give Augmentin  and Tessalon  - Continue allergy medications.  -  Steam and humidifier can help - Stay well hydrated and get plenty of rest.  - Seek in person evaluation if no symptom improvement or if symptoms worsen   Follow Up Instructions: I discussed the assessment and treatment plan with the patient. The patient was provided an opportunity to ask questions and all were answered. The patient agreed with the plan and demonstrated an understanding of the instructions.  A copy of instructions were sent to the patient via MyChart unless otherwise noted below.    The patient was advised to call back or seek an in-person evaluation if the symptoms worsen or if the condition fails to improve as anticipated.    Priscilla HERO Raeshawn Tafolla, [name]     [1] No Known Allergies [2]  Current Outpatient Medications:    amoxicillin -clavulanate (AUGMENTIN ) 875-125 MG tablet, Take 1 tablet by mouth 2 (two) times daily., Disp: 14 tablet, Rfl: 0   benzonatate  (TESSALON ) 100 MG capsule, Take 1-2 capsules (100-200 mg total) by mouth 3 (three) times daily as needed., Disp: 30 capsule, Rfl: 0   atorvastatin (LIPITOR) 10 MG tablet, Take 10 mg by mouth daily., Disp: , Rfl:    citalopram (CELEXA) 40 MG tablet, Take 40 mg by mouth daily., Disp: , Rfl:   "

## 2024-02-26 NOTE — Patient Instructions (Signed)
 " Priscilla Bradshaw, thank you for joining [name], [name] for today's virtual visit.  While this provider is not your primary care provider (PCP), if your PCP is located in our provider database this encounter information will be shared with them immediately following your visit.   A [facility] MyChart account gives you access to today's visit and all your visits, tests, and labs performed at [facility]  click here if you don't have a [facility] MyChart account or go to [email or web address]  Consent: (Patient) Priscilla Bradshaw provided verbal consent for this virtual visit at the beginning of the encounter.  Current Medications:  Current Outpatient Medications:    amoxicillin -clavulanate (AUGMENTIN ) 875-125 MG tablet, Take 1 tablet by mouth 2 (two) times daily., Disp: 14 tablet, Rfl: 0   benzonatate  (TESSALON ) 100 MG capsule, Take 1-2 capsules (100-200 mg total) by mouth 3 (three) times daily as needed., Disp: 30 capsule, Rfl: 0   atorvastatin (LIPITOR) 10 MG tablet, Take 10 mg by mouth daily., Disp: , Rfl:    citalopram (CELEXA) 40 MG tablet, Take 40 mg by mouth daily., Disp: , Rfl:    Medications ordered in this encounter:  Meds ordered this encounter  Medications   amoxicillin -clavulanate (AUGMENTIN ) 875-125 MG tablet    Sig: Take 1 tablet by mouth 2 (two) times daily.    Dispense:  14 tablet    Refill:  0    Supervising Provider:   LAMPTEY, PHILIP O [[id number]]   benzonatate  (TESSALON ) 100 MG capsule    Sig: Take 1-2 capsules (100-200 mg total) by mouth 3 (three) times daily as needed.    Dispense:  30 capsule    Refill:  0    Supervising Provider:   BLAISE ALEENE KIDD [[id number]]     *If you need refills on other medications prior to your next appointment, please contact your pharmacy*  Follow-Up: Call back or seek an in-person evaluation if the symptoms worsen or if the condition fails to improve as anticipated.  [facility] [phone]  Other Instructions Upper Respiratory Infection, Adult An upper respiratory infection (URI) is a common viral infection of the nose, throat, and upper air passages that lead to the lungs. The most common type of URI is the common cold. URIs usually get better on their own, without medical treatment. What are the causes? A URI is caused by a virus. You may catch a virus by: Breathing in droplets from an infected person's cough or sneeze. Touching something that has been exposed to the virus (is contaminated) and then touching your mouth, nose, or eyes. What increases the risk? You are more likely to get a URI if: You are very young or very old. You have close contact with others, such as at work, school, or a health care facility. You smoke. You have long-term (chronic) heart or lung disease. You have a weakened disease-fighting system (immune system). You have nasal allergies or asthma. You are experiencing a lot of stress. You have poor nutrition. What are the signs or symptoms? A URI usually involves some of the following symptoms: Runny or stuffy (congested) nose. Cough. Sneezing. Sore throat. Headache. Fatigue. Fever. Loss of appetite. Pain in your forehead, behind your eyes, and over your cheekbones (sinus pain). Muscle aches. Redness or irritation of the eyes. Pressure in the ears or face. How is this diagnosed? This condition may be diagnosed based on your medical history and symptoms, and a physical exam. Your health care provider may  use a swab to take a mucus sample from your nose (nasal swab). This sample can be tested to determine what virus is causing the illness. How is this treated? URIs usually get better on their own within 7-10 days. Medicines cannot cure URIs, but your health care provider may recommend certain medicines to help relieve symptoms, such as: Over-the-counter cold medicines. Cough suppressants. Coughing is a type of defense against  infection that helps to clear the respiratory system, so take these medicines only as recommended by your health care provider. Fever-reducing medicines. Follow these instructions at home: Activity Rest as needed. If you have a fever, stay home from work or school until your fever is gone or until your health care provider says your URI cannot spread to other people (is no longer contagious). Your health care provider may have you wear a face mask to prevent your infection from spreading. Relieving symptoms Gargle with a mixture of salt and water 3-4 times a day or as needed. To make salt water, completely dissolve -1 tsp (3-6 g) of salt in 1 cup (237 mL) of warm water. Use a cool-mist humidifier to add moisture to the air. This can help you breathe more easily. Eating and drinking  Drink enough fluid to keep your urine pale yellow. Eat soups and other clear broths. General instructions  Take over-the-counter and prescription medicines only as told by your health care provider. These include cold medicines, fever reducers, and cough suppressants. Do not use any products that contain nicotine or tobacco. These products include cigarettes, chewing tobacco, and vaping devices, such as e-cigarettes. If you need help quitting, ask your health care provider. Stay away from secondhand smoke. Stay up to date on all immunizations, including the yearly (annual) flu vaccine. Keep all follow-up visits. This is important. How to prevent the spread of infection to others URIs can be contagious. To prevent the infection from spreading: Wash your hands with soap and water for at least 20 seconds. If soap and water are not available, use hand sanitizer. Avoid touching your mouth, face, eyes, or nose. Cough or sneeze into a tissue or your sleeve or elbow instead of into your hand or into the air.  Contact a health care provider if: You are getting worse instead of better. You have a fever or chills. Your  mucus is brown or red. You have yellow or brown discharge coming from your nose. You have pain in your face, especially when you bend forward. You have swollen neck glands. You have pain while swallowing. You have white areas in the back of your throat. Get help right away if: You have shortness of breath that gets worse. You have severe or persistent: Headache. Ear pain. Sinus pain. Chest pain. You have chronic lung disease along with any of the following: Making high-pitched whistling sounds when you breathe, most often when you breathe out (wheezing). Prolonged cough (more than 14 days). Coughing up blood. A change in your usual mucus. You have a stiff neck. You have changes in your: Vision. Hearing. Thinking. Mood. These symptoms may be an emergency. Get help right away. Call 911. Do not wait to see if the symptoms will go away. Do not drive yourself to the hospital. Summary An upper respiratory infection (URI) is a common infection of the nose, throat, and upper air passages that lead to the lungs. A URI is caused by a virus. URIs usually get better on their own within 7-10 days. Medicines cannot cure URIs, but  your health care provider may recommend certain medicines to help relieve symptoms. This information is not intended to replace advice given to you by your health care provider. Make sure you discuss any questions you have with your health care provider. Document Revised: 09/22/2020 Document Reviewed: 09/22/2020 Elsevier Patient Education  2024 [employer]   If you have been instructed to have an in-person evaluation today at a local Urgent Care facility, please use the link below. It will take you to a list of all of our available [facility], including address, phone number and hours of operation. Please do not delay care.  [facility]  If you or a family member do not have a primary care provider, use the link below to schedule a  visit and establish care. When you choose a [facility] primary care physician or advanced practice provider, you gain a long-term partner in health. Find a Primary Care Provider  Learn more about [facility]'s in-office and virtual care options:  - Get Care Now "

## 2024-03-03 ENCOUNTER — Ambulatory Visit
Admission: RE | Admit: 2024-03-03 | Discharge: 2024-03-03 | Disposition: A | Discharge status: Home / Self Care | Source: Ambulatory Visit | Attending: Family Medicine | Admitting: Family Medicine

## 2024-03-03 ENCOUNTER — Ambulatory Visit

## 2024-03-03 DIAGNOSIS — Z1231 Encounter for screening mammogram for malignant neoplasm of breast: Secondary | ICD-10-CM | POA: Insufficient documentation
